# Patient Record
Sex: Male | Born: 1980 | Race: Black or African American | Hispanic: No | Marital: Single | Smoking: Current every day smoker
Health system: Southern US, Community
[De-identification: ages and names within clinical notes are randomized; demographics above are authoritative.]

## PROBLEM LIST (undated history)

## (undated) DIAGNOSIS — I10 Essential (primary) hypertension: Secondary | ICD-10-CM

---

## 2020-04-22 ENCOUNTER — Other Ambulatory Visit: Payer: Self-pay

## 2020-04-22 ENCOUNTER — Emergency Department: Payer: Self-pay

## 2020-04-22 ENCOUNTER — Observation Stay
Admission: EM | Admit: 2020-04-22 | Discharge: 2020-04-23 | Disposition: A | Discharge status: Home / Self Care | Payer: Self-pay | Attending: Hospitalist | Admitting: Hospitalist

## 2020-04-22 DIAGNOSIS — F1490 Cocaine use, unspecified, uncomplicated: Secondary | ICD-10-CM

## 2020-04-22 DIAGNOSIS — J189 Pneumonia, unspecified organism: Secondary | ICD-10-CM

## 2020-04-22 DIAGNOSIS — R0902 Hypoxemia: Secondary | ICD-10-CM

## 2020-04-22 DIAGNOSIS — R825 Elevated urine levels of drugs, medicaments and biological substances: Secondary | ICD-10-CM

## 2020-04-22 DIAGNOSIS — Z20822 Contact with and (suspected) exposure to covid-19: Secondary | ICD-10-CM | POA: Insufficient documentation

## 2020-04-22 DIAGNOSIS — I10 Essential (primary) hypertension: Secondary | ICD-10-CM | POA: Insufficient documentation

## 2020-04-22 DIAGNOSIS — J9601 Acute respiratory failure with hypoxia: Principal | ICD-10-CM | POA: Insufficient documentation

## 2020-04-22 DIAGNOSIS — E8729 Other acidosis: Secondary | ICD-10-CM

## 2020-04-22 DIAGNOSIS — G9341 Metabolic encephalopathy: Secondary | ICD-10-CM

## 2020-04-22 DIAGNOSIS — F17210 Nicotine dependence, cigarettes, uncomplicated: Secondary | ICD-10-CM | POA: Insufficient documentation

## 2020-04-22 DIAGNOSIS — R7989 Other specified abnormal findings of blood chemistry: Secondary | ICD-10-CM

## 2020-04-22 DIAGNOSIS — R000 Tachycardia, unspecified: Secondary | ICD-10-CM | POA: Insufficient documentation

## 2020-04-22 HISTORY — DX: Essential (primary) hypertension: I10

## 2020-04-22 LAB — CBC WITH DIFFERENTIAL/PLATELET
Abs Immature Granulocytes: 0.2 10*3/uL — ABNORMAL HIGH (ref 0.00–0.07)
Basophils Absolute: 0.1 10*3/uL (ref 0.0–0.1)
Basophils Relative: 1 %
Eosinophils Absolute: 0.3 10*3/uL (ref 0.0–0.5)
Eosinophils Relative: 2 %
HCT: 47.8 % (ref 39.0–52.0)
Hemoglobin: 15.2 g/dL (ref 13.0–17.0)
Immature Granulocytes: 1 %
Lymphocytes Relative: 25 %
Lymphs Abs: 3.9 10*3/uL (ref 0.7–4.0)
MCH: 22.5 pg — ABNORMAL LOW (ref 26.0–34.0)
MCHC: 31.8 g/dL (ref 30.0–36.0)
MCV: 70.7 fL — ABNORMAL LOW (ref 80.0–100.0)
Monocytes Absolute: 0.6 10*3/uL (ref 0.1–1.0)
Monocytes Relative: 4 %
Neutro Abs: 10.6 10*3/uL — ABNORMAL HIGH (ref 1.7–7.7)
Neutrophils Relative %: 67 %
Platelets: 323 10*3/uL (ref 150–400)
RBC: 6.76 MIL/uL — ABNORMAL HIGH (ref 4.22–5.81)
RDW: 18.1 % — ABNORMAL HIGH (ref 11.5–15.5)
WBC: 15.6 10*3/uL — ABNORMAL HIGH (ref 4.0–10.5)
nRBC: 0 % (ref 0.0–0.2)

## 2020-04-22 LAB — URINALYSIS, COMPLETE (UACMP) WITH MICROSCOPIC
Bacteria, UA: NONE SEEN
Bilirubin Urine: NEGATIVE
Glucose, UA: NEGATIVE mg/dL
Hgb urine dipstick: NEGATIVE
Ketones, ur: NEGATIVE mg/dL
Leukocytes,Ua: NEGATIVE
Nitrite: NEGATIVE
Protein, ur: NEGATIVE mg/dL
Specific Gravity, Urine: 1.034 — ABNORMAL HIGH (ref 1.005–1.030)
pH: 5 (ref 5.0–8.0)

## 2020-04-22 LAB — LACTIC ACID, PLASMA
Lactic Acid, Venous: 2.7 mmol/L (ref 0.5–1.9)
Lactic Acid, Venous: 1.1 mmol/L (ref 0.5–1.9)

## 2020-04-22 LAB — URINE DRUG SCREEN, QUALITATIVE (ARMC ONLY)
Amphetamines, Ur Screen: NOT DETECTED
Barbiturates, Ur Screen: NOT DETECTED
Benzodiazepine, Ur Scrn: NOT DETECTED
Cannabinoid 50 Ng, Ur ~~LOC~~: NOT DETECTED
Cocaine Metabolite,Ur ~~LOC~~: POSITIVE — AB
MDMA (Ecstasy)Ur Screen: NOT DETECTED
Methadone Scn, Ur: NOT DETECTED
Opiate, Ur Screen: NOT DETECTED
Phencyclidine (PCP) Ur S: NOT DETECTED
Tricyclic, Ur Screen: NOT DETECTED

## 2020-04-22 LAB — BLOOD GAS, ARTERIAL
Acid-base deficit: 2.8 mmol/L — ABNORMAL HIGH (ref 0.0–2.0)
Bicarbonate: 26 mmol/L (ref 20.0–28.0)
FIO2: 1
O2 Saturation: 99.7 %
Patient temperature: 37
pCO2 arterial: 62 mmHg — ABNORMAL HIGH (ref 32.0–48.0)
pH, Arterial: 7.23 — ABNORMAL LOW (ref 7.350–7.450)
pO2, Arterial: 222 mmHg — ABNORMAL HIGH (ref 83.0–108.0)

## 2020-04-22 LAB — COMPREHENSIVE METABOLIC PANEL
ALT: 16 U/L (ref 0–44)
AST: 23 U/L (ref 15–41)
Albumin: 3.9 g/dL (ref 3.5–5.0)
Alkaline Phosphatase: 82 U/L (ref 38–126)
Anion gap: 9 (ref 5–15)
BUN: 13 mg/dL (ref 6–20)
CO2: 23 mmol/L (ref 22–32)
Calcium: 8.5 mg/dL — ABNORMAL LOW (ref 8.9–10.3)
Chloride: 104 mmol/L (ref 98–111)
Creatinine, Ser: 1.23 mg/dL (ref 0.61–1.24)
GFR, Estimated: >60 mL/min (ref >60)
Glucose, Bld: 219 mg/dL — ABNORMAL HIGH (ref 70–99)
Potassium: 3.2 mmol/L — ABNORMAL LOW (ref 3.5–5.1)
Sodium: 136 mmol/L (ref 135–145)
Total Bilirubin: 0.4 mg/dL (ref 0.3–1.2)
Total Protein: 6.9 g/dL (ref 6.5–8.1)

## 2020-04-22 LAB — BLOOD GAS, VENOUS
Acid-base deficit: 4.6 mmol/L — ABNORMAL HIGH (ref 0.0–2.0)
Bicarbonate: 23.6 mmol/L (ref 20.0–28.0)
O2 Saturation: 88.7 %
Patient temperature: 37
pCO2, Ven: 55 mmHg (ref 44.0–60.0)
pH, Ven: 7.24 — ABNORMAL LOW (ref 7.250–7.430)
pO2, Ven: 66 mmHg — ABNORMAL HIGH (ref 32.0–45.0)

## 2020-04-22 LAB — TROPONIN I (HIGH SENSITIVITY)
Troponin I (High Sensitivity): 10 ng/L (ref <18)
Troponin I (High Sensitivity): 11 ng/L (ref <18)

## 2020-04-22 LAB — RESP PANEL BY RT-PCR (FLU A&B, COVID) ARPGX2
Influenza A by PCR: NEGATIVE
Influenza B by PCR: NEGATIVE
SARS Coronavirus 2 by RT PCR: NEGATIVE

## 2020-04-22 LAB — ACETAMINOPHEN LEVEL: Acetaminophen (Tylenol), Serum: <10 ug/mL — ABNORMAL LOW (ref 10–30)

## 2020-04-22 LAB — PROCALCITONIN: Procalcitonin: 0.34 ng/mL

## 2020-04-22 LAB — ETHANOL: Alcohol, Ethyl (B): 18 mg/dL — ABNORMAL HIGH (ref <10)

## 2020-04-22 LAB — BRAIN NATRIURETIC PEPTIDE: B Natriuretic Peptide: 38.2 pg/mL (ref 0.0–100.0)

## 2020-04-22 IMAGING — CT CT ANGIO CHEST
2 of 6 series · 19 of 46 positions shown · IV contrast (APPLIED)
Comparison: None.

CLINICAL DATA: Hypoxia with tachycardia

EXAM:
CT ANGIOGRAPHY CHEST WITH CONTRAST
TECHNIQUE: Multidetector CT imaging of the chest was performed using the
standard protocol during bolus administration of intravenous
contrast. Multiplanar CT image reconstructions and MIPs were
obtained to evaluate the vascular anatomy.
CONTRAST:  75mL OMNIPAQUE IOHEXOL 350 MG/ML SOLN

[Series 5: thins · axial · 0.62mm/px · z∈[-373,-98]mm · 17 of 303 slices shown]
[im 14/303  lung]
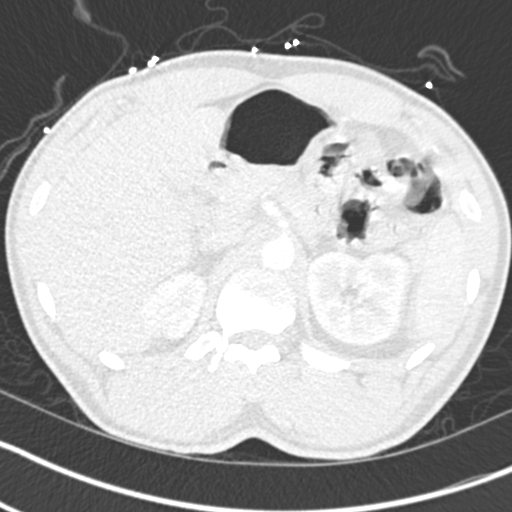
[im 27/303  soft-tissue]
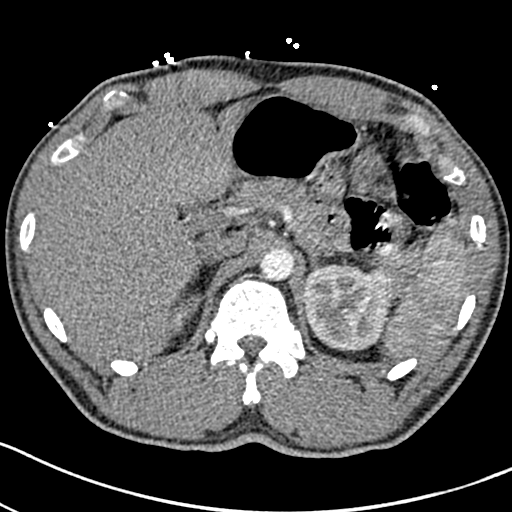
[im 53/303  lung]
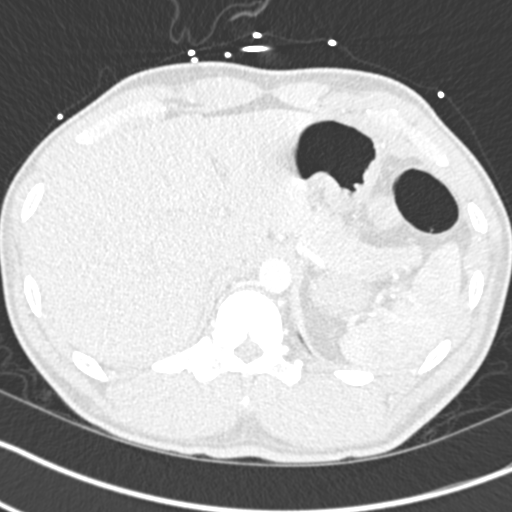
[im 66/303  soft-tissue]
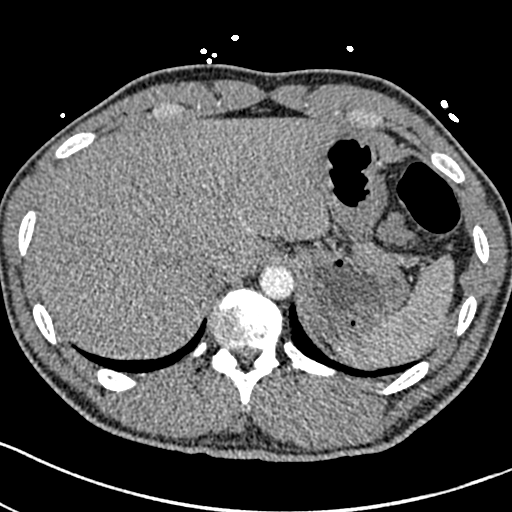
[im 79/303  lung]
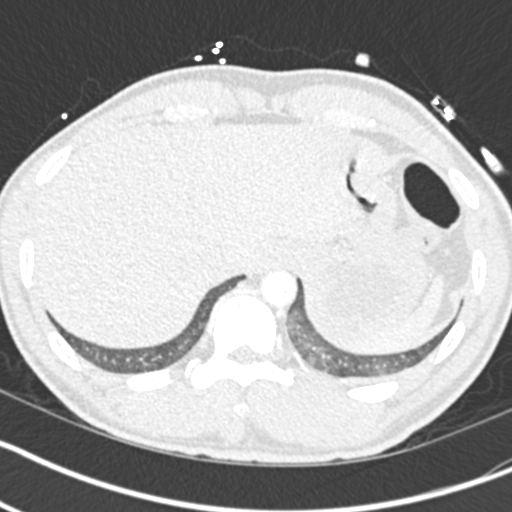
[im 106/303  soft-tissue]
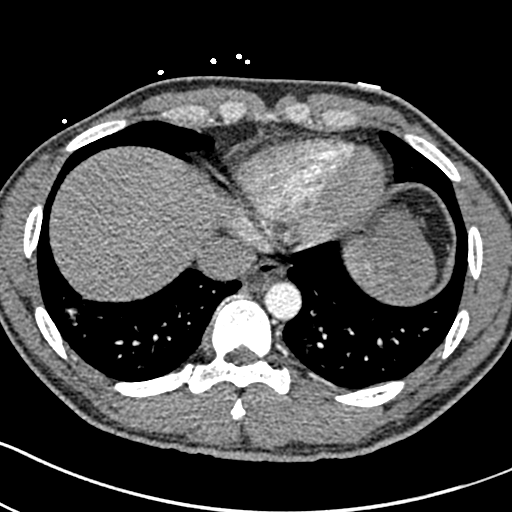
[im 119/303  lung]
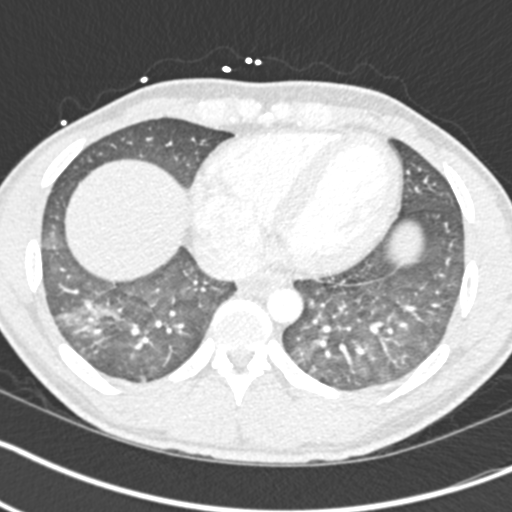
[im 132/303  soft-tissue]
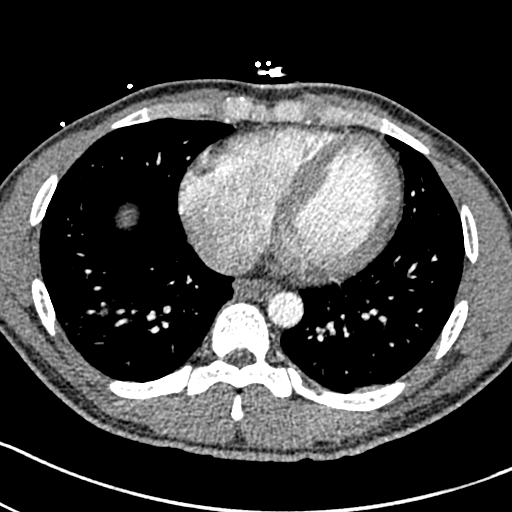
[im 158/303  lung]
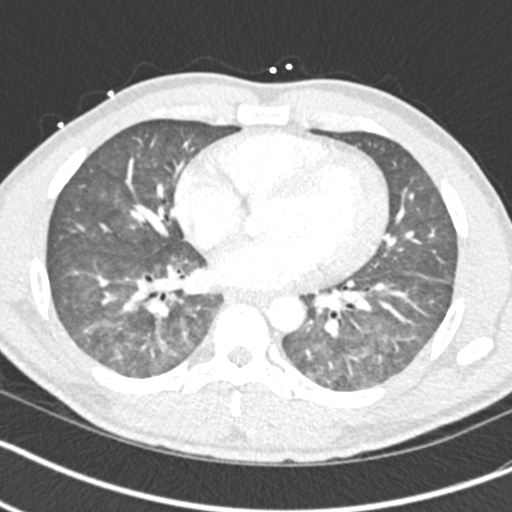
[im 171/303  soft-tissue]
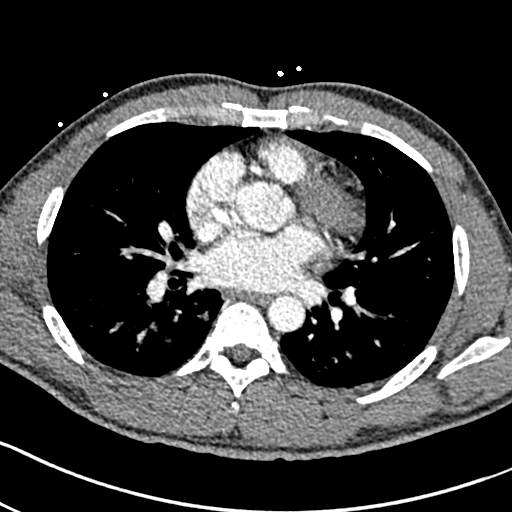
[im 184/303  lung]
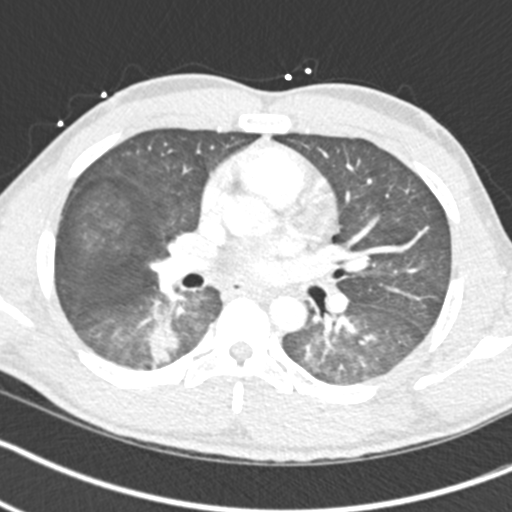
[im 197/303  soft-tissue]
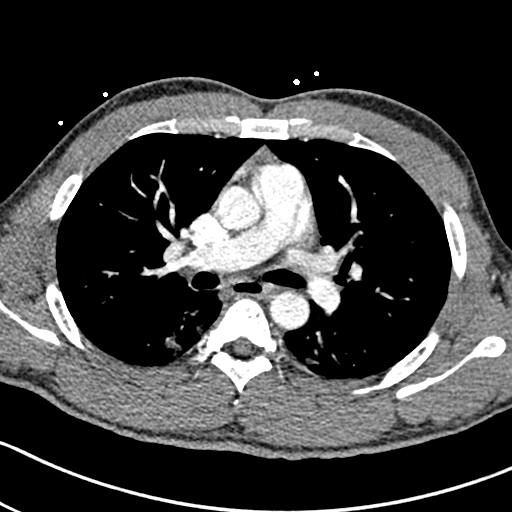
[im 224/303  lung]
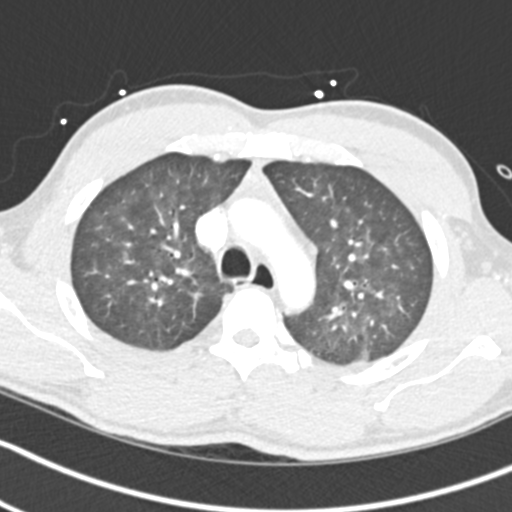
[im 237/303  soft-tissue]
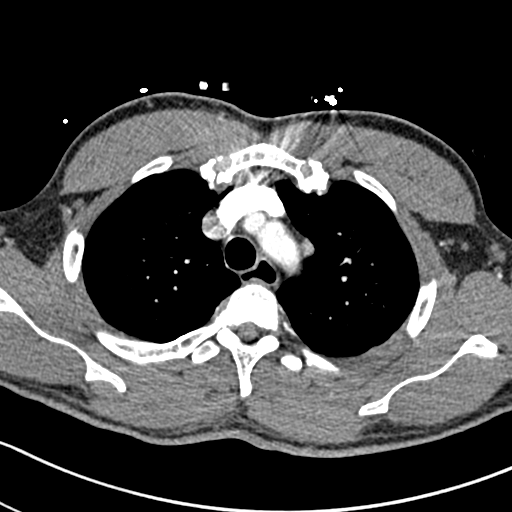
[im 250/303  lung]
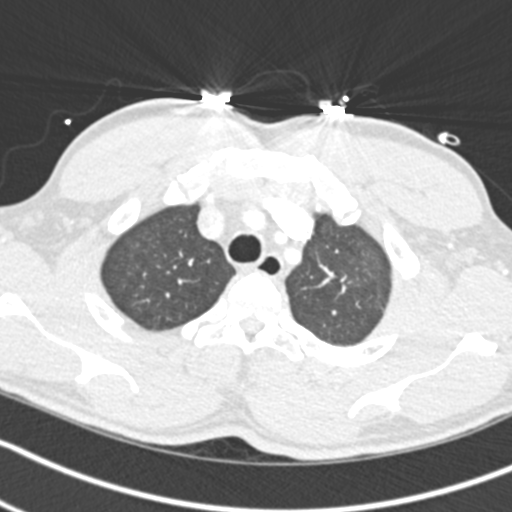
[im 276/303  soft-tissue]
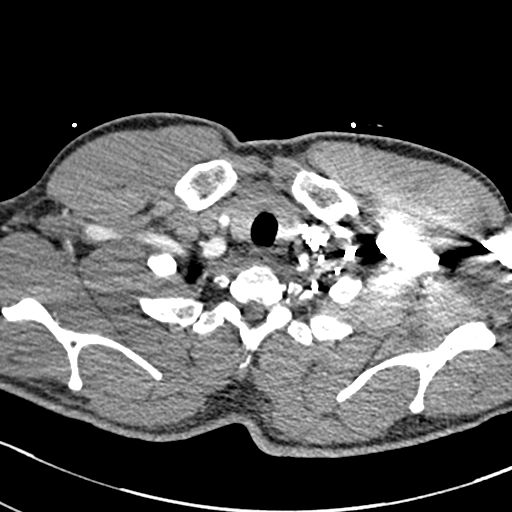
[im 289/303  lung]
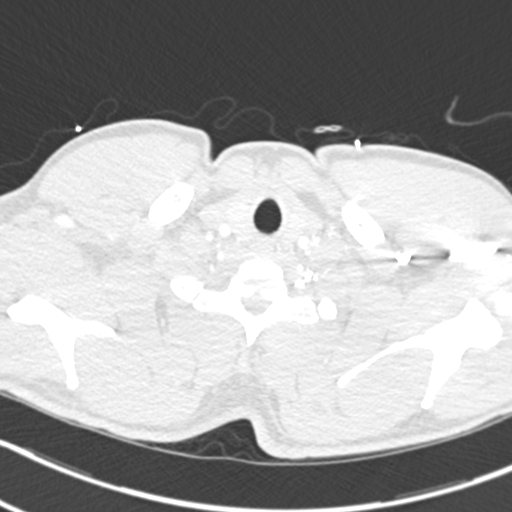

[Series 7: coronal mpr · coronal · 0.60mm/px · 2 of 88 slices shown]
[im 30/88  soft-tissue]
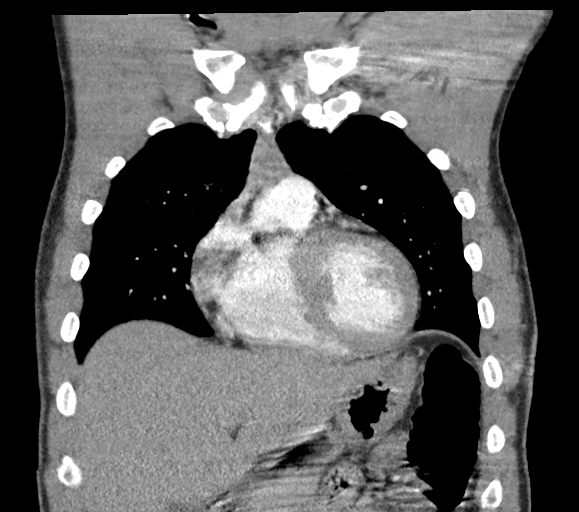
[im 59/88  soft-tissue]
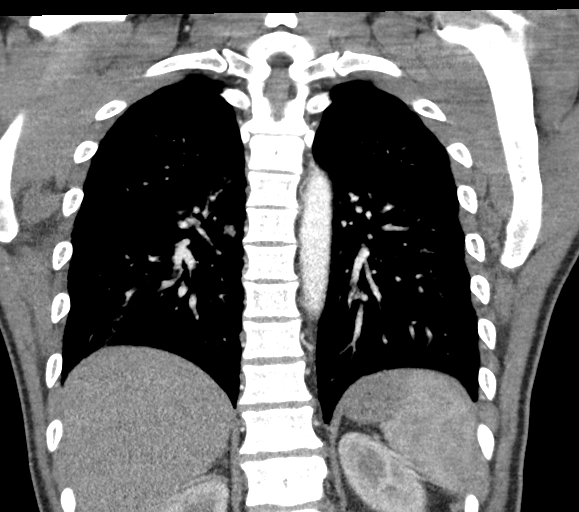

[19 of 46 positions shown; findings below may reference images not displayed]

FINDINGS: Cardiovascular: Satisfactory opacification of the pulmonary arteries
to the segmental level. No evidence of pulmonary embolism. Normal
heart size. No pericardial effusion.

Mediastinum/Nodes: Fluid within the distal esophagus may reflect
reflux. Thyroid is unremarkable. There are no enlarged lymph nodes
identified.

Lungs/Pleura: Bilateral ground-glass greater than consolidative
opacities. No pleural effusion. No pneumothorax.

Upper Abdomen: No acute abnormality.

Musculoskeletal: No acute osseous abnormality.

Review of the MIP images confirms the above findings.
IMPRESSION: No evidence of acute pulmonary embolism.

Bilateral pulmonary opacities likely reflect multifocal pneumonia.
Pulmonary edema is also a consideration.

## 2020-04-22 MED ORDER — ONDANSETRON HCL 4 MG/2ML IJ SOLN: 4.0000 mg | Freq: Four times a day (QID) | INTRAMUSCULAR | Status: DC | PRN

## 2020-04-22 MED ORDER — LACTATED RINGERS IV BOLUS
1000.0000 mL | Freq: Once | INTRAVENOUS | Status: AC
  Administered 2020-04-22: 18:00:00 1000 mL via INTRAVENOUS

## 2020-04-22 MED ORDER — CEFTRIAXONE SODIUM 2 G IJ SOLR: 2.0000 g | INTRAMUSCULAR | Status: DC

## 2020-04-22 MED ORDER — IOHEXOL 350 MG/ML SOLN
75.0000 mL | Freq: Once | INTRAVENOUS | Status: AC | PRN
  Administered 2020-04-22: 18:00:00 75 mL via INTRAVENOUS

## 2020-04-22 MED ORDER — ONDANSETRON HCL 4 MG PO TABS: 4.0000 mg | ORAL_TABLET | Freq: Four times a day (QID) | ORAL | Status: DC | PRN

## 2020-04-22 MED ORDER — SODIUM CHLORIDE 0.9 % IV SOLN: 2.0000 g | INTRAVENOUS | Status: DC

## 2020-04-22 MED ORDER — SODIUM CHLORIDE 0.9 % IV SOLN
500.0000 mg | Freq: Once | INTRAVENOUS | Status: AC
  Administered 2020-04-22: 18:00:00 500 mg via INTRAVENOUS
  Filled 2020-04-22: qty 500

## 2020-04-22 MED ORDER — SODIUM CHLORIDE 0.9 % IV SOLN
1.0000 g | Freq: Once | INTRAVENOUS | Status: AC
  Administered 2020-04-22: 17:00:00 1 g via INTRAVENOUS
  Filled 2020-04-22: qty 10

## 2020-04-22 MED ORDER — SODIUM CHLORIDE 0.9 % IV SOLN: 500.0000 mg | INTRAVENOUS | Status: DC

## 2020-04-22 MED ORDER — SODIUM CHLORIDE 0.9 % IV SOLN
1.0000 g | Freq: Once | INTRAVENOUS | Status: AC
  Administered 2020-04-22: 1 g via INTRAVENOUS
  Filled 2020-04-22: qty 10

## 2020-04-22 MED ORDER — HYDROCODONE-ACETAMINOPHEN 5-325 MG PO TABS: 1.0000 | ORAL_TABLET | ORAL | Status: DC | PRN

## 2020-04-22 MED ORDER — ACETAMINOPHEN 650 MG RE SUPP: 650.0000 mg | Freq: Four times a day (QID) | RECTAL | Status: DC | PRN

## 2020-04-22 MED ORDER — ENOXAPARIN SODIUM 60 MG/0.6ML ~~LOC~~ SOLN
45.0000 mg | SUBCUTANEOUS | Status: DC
  Administered 2020-04-22: 21:00:00 45 mg via SUBCUTANEOUS
  Filled 2020-04-22: qty 0.6

## 2020-04-22 MED ORDER — SODIUM CHLORIDE 0.9 % IV SOLN: INTRAVENOUS | Status: DC

## 2020-04-22 MED ORDER — SODIUM CHLORIDE 0.9 % IV BOLUS
1000.0000 mL | Freq: Once | INTRAVENOUS | Status: AC
  Administered 2020-04-22: 17:00:00 1000 mL via INTRAVENOUS

## 2020-04-22 MED ORDER — ACETAMINOPHEN 325 MG PO TABS: 650.0000 mg | ORAL_TABLET | Freq: Four times a day (QID) | ORAL | Status: DC | PRN

## 2020-04-22 MED ORDER — AZITHROMYCIN 500 MG IV SOLR: 500.0000 mg | INTRAVENOUS | Status: DC

## 2020-04-22 NOTE — Progress Notes (Signed)
PHARMACIST - PHYSICIAN COMMUNICATION  CONCERNING:  Enoxaparin (Lovenox) for DVT Prophylaxis    RECOMMENDATION: Patient was prescribed enoxaprin 40mg  q24 hours for VTE prophylaxis.   Filed Weights   04/22/20 1435  Weight: 92.4 kg (203 lb 11.3 oz)    Body mass index is 30.08 kg/m.  Estimated Creatinine Clearance: 89.7 mL/min (by C-G formula based on SCr of 1.23 mg/dL).   Based on [facility] policy patient is candidate for enoxaparin 0.5mg /kg TBW SQ every 24 hours based on BMI being >30.   DESCRIPTION: Pharmacy has adjusted enoxaparin dose per [facility] policy.  Patient is now receiving enoxaparin 45 mg every 24hours    [facility], PharmD Clinical Pharmacist  04/22/2020 7:56 PM

## 2020-04-22 NOTE — H&P (Signed)
History and Physical    Douglas Barker OVF:[id number] DOB: [birth year] DOA: 04/22/2020  PCP: Patient, No Pcp Per   Patient coming from: Home  I have personally briefly reviewed patient's old medical records in [facility]  Chief Complaint: Passed out in car  HPI: Douglas Barker is a 39 y.o. male with past medical history significant for hypertension who was brought in by EMS after passing out while sitting in the driver seat of a car with the car running.  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  On their arrival O2 sat was in the low 80s on room air improving to 89% on 3 L. After arrival of patient's significant other, she says that for the past several months patient has had episodes where he would fall asleep suddenly.  She states he took a drink at a restaurant the night prior and when she got home today he had a half empty can of malt beside the bed.  he denies illicit drug use.  Drinks only on occasions. He denies feeling unwell prior to this incident.  He was in his usual state of health.  He denies prior cough, shortness of breath, fever or chest pain or any recent illness ED Course: On arrival to the ER, BP 158/98, pulse 138, respirations 29 satting at 97% on nonrebreather blood work significant for leukocytosis of 15,000 with initial lactic acid of 2.7>>1.1.  ABG showed pH of 7.24, PCO2 of 55 and PO2 of 66.Marland Kitchen  EtOH level 18, UDS positive only for cocaine, negative for opiates.  Procalcitonin 0.34.  BNP 38.  Troponin negative at 11.  Urinalysis without pyuria.  Covid and flu test negative EKG as reviewed by me : Sinus tach at 135 with no acute ST-T wave changes. Imaging:CTA chest showed no evidence of PE did show bilateral pulmonary opacities likely reflecting multifocal pneumonia, pulmonary edema is also a  consideration  Review of Systems: As per HPI otherwise all other systems on review of systems negative, though limited due to somnolence.   Past Medical History:  Diagnosis Date  . Hypertension     History reviewed. No pertinent surgical history.   reports that he has been smoking cigarettes. He has been smoking about 0.20 packs per day. He has never used smokeless tobacco. He reports current alcohol use of about 2.0 standard drinks of alcohol per week. No history on file for drug use.  Not on File  History reviewed. No pertinent family history.    Prior to Admission medications   Not on File    Physical Exam: Vitals:   04/22/20 1830 04/22/20 1845 04/22/20 1900 04/22/20 1915  BP: 128/77  140/76   Pulse: 86 85 83 78  Resp:      Temp:      TempSrc:      SpO2: 93% 95% 91% 93%  Weight:      Height:         Vitals:   04/22/20 1830 04/22/20 1845 04/22/20 1900 04/22/20 1915  BP: 128/77  140/76   Pulse: 86 85 83 78  Resp:      Temp:      TempSrc:      SpO2: 93% 95% 91% 93%  Weight:      Height:          Constitutional:  Very  somnolent but  intermittently awake and alert but falling asleep in the middle of his sentences. Not in any apparent distress HEENT:      Head: Normocephalic and atraumatic.         Eyes: PERLA, EOMI, Conjunctivae are normal. Sclera is non-icteric.       Mouth/Throat: Mucous membranes are moist.       Neck: Supple with no signs of meningismus. Cardiovascular: Regular rate and rhythm. No murmurs, gallops, or rubs. 2+ symmetrical distal pulses are present . No JVD. No LE edema Respiratory: Respiratory effort normal .Lungs sounds clear bilaterally. No wheezes, crackles, or rhonchi.  Gastrointestinal: Soft, non tender, and non distended with positive bowel sounds.  Genitourinary: No CVA tenderness. Musculoskeletal: Nontender with normal range of motion in all extremities. No cyanosis, or erythema of extremities. Neurologic:  Face is symmetric.  Moving all extremities. No gross focal neurologic deficits . Skin: Skin is warm, dry.  No rash or ulcers Psychiatric: Mood and affect are normal    Labs on Admission: I have personally reviewed following labs and imaging studies  CBC: Recent Labs  Lab 04/22/20 1425  WBC 15.6*  NEUTROABS 10.6*  HGB 15.2  HCT 47.8  MCV 70.7*  PLT 323   Basic Metabolic Panel: Recent Labs  Lab 04/22/20 1425  NA 136  K 3.2*  CL 104  CO2 23  GLUCOSE 219*  BUN 13  CREATININE 1.23  CALCIUM 8.5*   GFR: Estimated Creatinine Clearance: 89.7 mL/min (by C-G formula based on SCr of 1.23 mg/dL). Liver Function Tests: Recent Labs  Lab 04/22/20 1425  AST 23  ALT 16  ALKPHOS 82  BILITOT 0.4  PROT 6.9  ALBUMIN 3.9   No results for input(s): LIPASE, AMYLASE in the last 168 hours. No results for input(s): AMMONIA in the last 168 hours. Coagulation Profile: No results for input(s): INR, PROTIME in the last 168 hours. Cardiac Enzymes: No results for input(s): CKTOTAL, CKMB, CKMBINDEX, TROPONINI in the last 168 hours. BNP (last 3 results) No results for input(s): PROBNP in the last 8760 hours. HbA1C: No results for input(s): HGBA1C in the last 72 hours. CBG: No results for input(s): GLUCAP in the last 168 hours. Lipid Profile: No results for input(s): CHOL, HDL, LDLCALC, TRIG, CHOLHDL, LDLDIRECT in the last 72 hours. Thyroid Function Tests: No results for input(s): TSH, T4TOTAL, FREET4, T3FREE, THYROIDAB in the last 72 hours. Anemia Panel: No results for input(s): VITAMINB12, FOLATE, FERRITIN, TIBC, IRON, RETICCTPCT in the last 72 hours. Urine analysis:    Component Value Date/Time   COLORURINE YELLOW (A) 04/22/2020 1910   APPEARANCEUR CLEAR (A) 04/22/2020 1910   LABSPEC 1.034 (H) 04/22/2020 1910   PHURINE 5.0 04/22/2020 1910   GLUCOSEU NEGATIVE 04/22/2020 1910   HGBUR NEGATIVE 04/22/2020 1910   BILIRUBINUR NEGATIVE 04/22/2020 1910   KETONESUR NEGATIVE 04/22/2020 1910   PROTEINUR  NEGATIVE 04/22/2020 1910   NITRITE NEGATIVE 04/22/2020 1910   LEUKOCYTESUR NEGATIVE 04/22/2020 1910    Radiological Exams on Admission: CT Angio Chest PE W and/or Wo Contrast  Result Date: 04/22/2020 CLINICAL DATA:  Hypoxia with tachycardia EXAM: CT ANGIOGRAPHY CHEST WITH CONTRAST TECHNIQUE: Multidetector CT imaging of the chest was performed using the standard protocol during bolus administration of intravenous contrast. Multiplanar CT image reconstructions and MIPs were obtained to evaluate the vascular anatomy. CONTRAST:  38mL OMNIPAQUE IOHEXOL 350 MG/ML SOLN COMPARISON:  None. FINDINGS: Cardiovascular: Satisfactory opacification of the pulmonary arteries to the segmental level. No evidence of pulmonary embolism. Normal heart  size. No pericardial effusion. Mediastinum/Nodes: Fluid within the distal esophagus may reflect reflux. Thyroid is unremarkable. There are no enlarged lymph nodes identified. Lungs/Pleura: Bilateral ground-glass greater than consolidative opacities. No pleural effusion. No pneumothorax. Upper Abdomen: No acute abnormality. Musculoskeletal: No acute osseous abnormality. Review of the MIP images confirms the above findings. IMPRESSION: No evidence of acute pulmonary embolism. Bilateral pulmonary opacities likely reflect multifocal pneumonia. Pulmonary edema is also a consideration. Electronically Signed   By: Guadlupe Spanish M.D.   On: 04/22/2020 18:26   DG Chest Portable 1 View  Result Date: 04/22/2020 CLINICAL DATA:  Hypoxia. EXAM: PORTABLE CHEST 1 VIEW COMPARISON:  None. FINDINGS: The heart size and mediastinal contours are within normal limits. Both lungs are clear. No pneumothorax or pleural effusion is noted. The visualized skeletal structures are unremarkable. IMPRESSION: No active disease. Electronically Signed   By: Lupita Raider M.D.   On: 04/22/2020 14:56     Assessment/Plan 39 year old male with history of hypertension who was brought in by EMS after passing out  while sitting in the driver seat of a car reversed with Narcan.  O2 sat 80% on room air   Acute respiratory failure with hypoxia with respiratory acidosis Urine drug screen positive for cocaine  Acute metabolic encephalopathy -Patient found unconscious sitting in the car, reversed with Narcan but continues to be very somnolent, finding it difficult to stay awake -O2 sat on arrival of EMS in the 80s with ABG showing pH 7.24, PO2 66 with normal PCO2 -Urine drug screen positive for cocaine negative for opiates -Acute respiratory failure secondary to respiratory depression from possible ingestion, multifocal pneumonia/possible pulmonary edema -Patient's girlfriend who gives the history reports several months of patient suddenly falling asleep so may need sleep study -CTA chest negative for acute PE -Urine drug screen positive for cocaine but negative for opioids -Supplemental oxygen to keep sats over 92% -Neurochecks and aspiration precautions  Multifocal pneumonia -CTA showing bilateral multifocal opacities that can reflect pneumonia or pulmonary edema. -Does not appear to be aspiration -Rocephin and azithromycin -Supplemental oxygen -Supportive and symptomatic care    DVT prophylaxis: Lovenox  Code Status: full code  Family Communication: Girlfriend at baseline, French Ana suits Disposition Plan: Back to previous home environment Consults called: none  Status: Observation    Andris Baumann MD [facility]     04/22/2020, 7:54 PM

## 2020-04-22 NOTE — ED Notes (Signed)
Resumed care from noah rn  Pt alert  ua to lab  Family with pt

## 2020-04-22 NOTE — ED Notes (Signed)
Pt reports smoking occasionally, says it takes him a few days to go through a pack. Denies any other drug use.   Pt says the last thing he remembers is dropping his cousin off. He reports he does not know what happened after, but he fell asleep. No notable needle tracks on arms. EMS IV attempt, but nothing else visible on arms. Pt states he donates plasma sometimes, some scarring to AC visible.   Pt mother concerned about drug use, though she states that has never been a problem before. States she does not have any specific reason to be concerned for that, aside from events per EMS.

## 2020-04-22 NOTE — ED Notes (Signed)
Pt wife at bedside. Reports that pt has been having problems with drowsiness/frequently falling asleep for years now. She states they have not been able to identify a clear cause, although they want to know about ruling out sleep apnea or narcolepsy.   Pt resting

## 2020-04-22 NOTE — ED Notes (Signed)
Medication Reconciliation Report  For Home History Technicians  HIGHLIGHTS:  1. The patient WAS personally interviewed 2. If not, what was the main source used: NOT APPLICABLE 3. Does the patient appear to take any anti-coagulation agents (e.g. warfarin, Eliquis or Xarelto): NO 4. Does the patient appear to take any anti-convulsant agents (e.g. divalproex, levetiracetam or phenytoin): NO 5. Does the patient appear to use any insulin products (e.g. Lantus, Novolin or Humalog): NO 6. Does the patient appear to take any "beta-blockers" (e.g. metoprolol, carvedilol or bisoprolol: NO  BARRIERS:  1. Were there any barriers that prevented or complicated the medication reconciliation process: NO 2. If yes, what was the primary barrier encountered: None 3. Does the patient appear compliant with prescribed medications: NO 4. Does the patient express any barriers with compliance: YES 5. What is the primary barrier the patient reports: Access to primary care   NOTES:[Include any concerns, remarks or complaints the patient expresses regarding medication therapy. Any observations or other information that might be useful to the treatment team can also be included. Immediate needs or concerns should be referred to the RN or appropriate member of the treatment team.]  Patient was interviewed and reports only taking HCTZ 25mg  daily. He does, however, admit taking it less frequently due to a lack of refills and no current PCP. Patient requests new RX for HYDROCHLOROTHIAZIDE 25mg  upon discharge, if deemed appropriate.    , CPhT [place] at [facility] [address] [place], [address] [place]  ** The above is intended solely for informational and/or communicative purposes. It should in no way be considered an endorsement of any specific treatment, therapy or action. **

## 2020-04-22 NOTE — ED Provider Notes (Signed)
[facility] Emergency Department Provider Note   ____________________________________________   Event Date/Time   First MD Initiated Contact with Patient 04/22/20 1408     (approximate)  I have reviewed the triage vital signs and the nursing notes.   HISTORY  Chief Complaint Respiratory Distress    HPI Douglas Barker is a 39 y.o. male EMS was called for CPR in progress.  On arrival there was no CPR being done.  Patient was sitting in driver seat of car with the car running.  Patient received a total of 3 mg of Narcan to intranasal and 1 IV before he woke up completely.  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  O2 sats here in the emergency room are in the high 80s with crackles bilaterally.  Patient denies doing any drugs.         Past Medical History:  Diagnosis Date  . Hypertension     There are no problems to display for this patient.    Prior to Admission medications   Not on File    Allergies Patient has no allergy information on record.  History reviewed. No pertinent family history.  Social History Social History   Tobacco Use  . Smoking status: Current Every Day Smoker    Packs/day: 0.20    Types: Cigarettes    Review of Systems  Constitutional: No fever/chills Eyes: No visual changes. ENT: No sore throat. Cardiovascular: Denies chest pain. Respiratory: Denies shortness of breath. Gastrointestinal: No abdominal pain.  No nausea, no vomiting.  No diarrhea.  No constipation. Genitourinary: Negative for dysuria. Musculoskeletal: Negative for back pain. Skin: Negative for rash. Neurological: Negative for headaches, focal weakness  ____________________________________________   PHYSICAL EXAM:  VITAL SIGNS: ED Triage Vitals  Enc Vitals Group     BP      Pulse      Resp      Temp      Temp src      SpO2      Weight      Height      Head Circumference      Peak Flow      Pain Score       Pain Loc      Pain Edu?      Excl. in GC?     Constitutional: Alert and oriented. Well appearing and in no acute distress. Eyes: Conjunctivae are normal. PER. EOMI. Head: Atraumatic. Nose: No congestion/rhinnorhea. Mouth/Throat: Mucous membranes are moist.  Oropharynx non-erythematous. Neck: No stridor. Cardiovascular: Normal rate, regular rhythm. Grossly normal heart sounds.  Good peripheral circulation. Respiratory: Normal respiratory effort.  No retractions. Lungs crackles two thirds of the way up bilaterally Gastrointestinal: Soft and nontender. No distention. No abdominal bruits.  Musculoskeletal: No lower extremity tenderness nor edema.   Neurologic:  Normal speech and language. No gross focal neurologic deficits are appreciated. N Skin:  Skin is warm, dry and intact. No rash noted. Psychiatric: Mood and affect are normal. Speech and behavior are normal.  ____________________________________________   LABS (all labs ordered are listed, but only abnormal results are displayed)  Labs Reviewed  COMPREHENSIVE METABOLIC PANEL - Abnormal; Notable for the following components:      Result Value   Potassium 3.2 (*)    Glucose, Bld 219 (*)    Calcium 8.5 (*)    All other components within normal limits  ACETAMINOPHEN LEVEL - Abnormal; Notable for the following components:   Acetaminophen (Tylenol),  Serum <10 (*)    All other components within normal limits  ETHANOL - Abnormal; Notable for the following components:   Alcohol, Ethyl (B) 18 (*)    All other components within normal limits  LACTIC ACID, PLASMA - Abnormal; Notable for the following components:   Lactic Acid, Venous 2.7 (*)    All other components within normal limits  CBC WITH DIFFERENTIAL/PLATELET - Abnormal; Notable for the following components:   WBC 15.6 (*)    RBC 6.76 (*)    MCV 70.7 (*)    MCH 22.5 (*)    RDW 18.1 (*)    Neutro Abs 10.6 (*)    Abs Immature Granulocytes 0.20 (*)    All other components  within normal limits  BLOOD GAS, VENOUS - Abnormal; Notable for the following components:   pH, Ven 7.24 (*)    pO2, Ven 66.0 (*)    Acid-base deficit 4.6 (*)    All other components within normal limits  BLOOD GAS, ARTERIAL - Abnormal; Notable for the following components:   pH, Arterial 7.23 (*)    pCO2 arterial 62 (*)    pO2, Arterial 222 (*)    Acid-base deficit 2.8 (*)    All other components within normal limits  RESP PANEL BY RT-PCR (FLU A&B, COVID) ARPGX2  BRAIN NATRIURETIC PEPTIDE  LACTIC ACID, PLASMA  PROCALCITONIN  URINALYSIS, COMPLETE (UACMP) WITH MICROSCOPIC  URINE DRUG SCREEN, QUALITATIVE ([facility] ONLY)  TROPONIN I (HIGH SENSITIVITY)  TROPONIN I (HIGH SENSITIVITY)   ____________________________________________  EKG EKG read interpreted by me shows sinus tachycardia rate of 135 normal axis no acute ST-T wave changes some decreased R wave progression. ____________________________________________  RADIOLOGY Jill Poling, personally viewed and evaluated these images (plain radiographs) as part of my medical decision making, as well as reviewing the written report by the radiologist.  ED MD interpretation: Chest x-ray reviewed by me shows some fine interstitial infiltrate bilateral  Official radiology report(s): CT Angio Chest PE W and/or Wo Contrast  Result Date: 04/22/2020 CLINICAL DATA:  Hypoxia with tachycardia EXAM: CT ANGIOGRAPHY CHEST WITH CONTRAST TECHNIQUE: Multidetector CT imaging of the chest was performed using the standard protocol during bolus administration of intravenous contrast. Multiplanar CT image reconstructions and MIPs were obtained to evaluate the vascular anatomy. CONTRAST:  67mL OMNIPAQUE IOHEXOL 350 MG/ML SOLN COMPARISON:  None. FINDINGS: Cardiovascular: Satisfactory opacification of the pulmonary arteries to the segmental level. No evidence of pulmonary embolism. Normal heart size. No pericardial effusion. Mediastinum/Nodes: Fluid within  the distal esophagus may reflect reflux. Thyroid is unremarkable. There are no enlarged lymph nodes identified. Lungs/Pleura: Bilateral ground-glass greater than consolidative opacities. No pleural effusion. No pneumothorax. Upper Abdomen: No acute abnormality. Musculoskeletal: No acute osseous abnormality. Review of the MIP images confirms the above findings. IMPRESSION: No evidence of acute pulmonary embolism. Bilateral pulmonary opacities likely reflect multifocal pneumonia. Pulmonary edema is also a consideration. Electronically Signed   By: Guadlupe Spanish M.D.   On: 04/22/2020 18:26   DG Chest Portable 1 View  Result Date: 04/22/2020 CLINICAL DATA:  Hypoxia. EXAM: PORTABLE CHEST 1 VIEW COMPARISON:  None. FINDINGS: The heart size and mediastinal contours are within normal limits. Both lungs are clear. No pneumothorax or pleural effusion is noted. The visualized skeletal structures are unremarkable. IMPRESSION: No active disease. Electronically Signed   By: Lupita Raider M.D.   On: 04/22/2020 14:56    ____________________________________________   PROCEDURES  Procedure(s) performed (including Critical Care): Critical care time half  an hour.  This includes talking to the patient several times reviewing his labs and records talking to the patient's wife in person and mother on the telephone and convincing the patient to stay in the hospital.  Procedures   ____________________________________________   INITIAL IMPRESSION / ASSESSMENT AND PLAN / ED COURSE  Patient with loss of consciousness that appeared to respond to 3 doses of Narcan.  Patient has crackles bilaterally on lung exam and is hypoxic with O2 sats in the 8889 range on room air.  Chest x-ray to my reading seems to have some very faint bilateral increased interstitial markings.  Patient's white count is normal he is not febrile however his procalcitonin is elevated at 0.34.  Patient possibly has early ARDS due to narcotic use or a  mild pneumonia.  Since he is hypoxic he will have to come into the hospital.  Patient has not been able to provide [place] with a urine specimen yet.  CT angios shows multifocal pneumonia or pulmonary edema.  Patient's BNP is negative.             ____________________________________________   FINAL CLINICAL IMPRESSION(S) / ED DIAGNOSES  Final diagnoses:  Hypoxia  Tachycardia  Elevated procalcitonin     ED Discharge Orders    None      *Please note:  Zalen Standard was evaluated in Emergency Department on 04/22/2020 for the symptoms described in the history of present illness. He was evaluated in the context of the global COVID-19 pandemic, which necessitated consideration that the patient might be at risk for infection with the SARS-CoV-2 virus that causes COVID-19. Institutional protocols and algorithms that pertain to the evaluation of patients at risk for COVID-19 are in a state of rapid change based on information released by regulatory bodies including the CDC and federal and state organizations. These policies and algorithms were followed during the patient's care in the ED.  Some ED evaluations and interventions may be delayed as a result of limited staffing during and the pandemic.*   Note:  This document was prepared using Dragon voice recognition software and may include unintentional dictation errors.    Arnaldo Natal, MD 04/22/20 1902

## 2020-04-22 NOTE — ED Notes (Signed)
Report off to chrissy rn  

## 2020-04-22 NOTE — ED Notes (Signed)
Pt reports hx of HTN, missed his meds this AM. Does not know what he takes.   Pt has removed NRB several times, each time desatting to 88-89%. Pt has NRB in place at 15L  Pt is a/o x4, but does appear somewhat slightly out of it. Pt gaze sometimes wanders, pt sometimes gets distracted while talking/listening. Pt does appear slightly drowsy, but RR maintained

## 2020-04-22 NOTE — ED Triage Notes (Signed)
Pt arrives via [facility] from  with complaint of CPR. Upon arrival, pt was sitting in driver's seat of car with car running. Pt had received 1mg  narcan with improvement.  Pt RR dropped again, received another 1mg  narcan with improvement. Additional 1mg  Narcan after IV start.   Upon arrival pt is alert and oriented x4, responsive to questions. 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 Pt SpO2 on arrival low 80's RA,  slow improvement to 89% 3L    190/105 BP SpO2 80's RA 90's 2L 270 CBG 140's HR

## 2020-04-23 DIAGNOSIS — J9601 Acute respiratory failure with hypoxia: Principal | ICD-10-CM

## 2020-04-23 LAB — CBC
HCT: 41.4 % (ref 39.0–52.0)
Hemoglobin: 13.3 g/dL (ref 13.0–17.0)
MCH: 22.6 pg — ABNORMAL LOW (ref 26.0–34.0)
MCHC: 32.1 g/dL (ref 30.0–36.0)
MCV: 70.3 fL — ABNORMAL LOW (ref 80.0–100.0)
Platelets: 242 10*3/uL (ref 150–400)
RBC: 5.89 MIL/uL — ABNORMAL HIGH (ref 4.22–5.81)
RDW: 18.1 % — ABNORMAL HIGH (ref 11.5–15.5)
WBC: 15.8 10*3/uL — ABNORMAL HIGH (ref 4.0–10.5)
nRBC: 0 % (ref 0.0–0.2)

## 2020-04-23 LAB — BASIC METABOLIC PANEL
Anion gap: 6 (ref 5–15)
BUN: 9 mg/dL (ref 6–20)
CO2: 25 mmol/L (ref 22–32)
Calcium: 8.5 mg/dL — ABNORMAL LOW (ref 8.9–10.3)
Chloride: 105 mmol/L (ref 98–111)
Creatinine, Ser: 1.05 mg/dL (ref 0.61–1.24)
GFR, Estimated: >60 mL/min (ref >60)
Glucose, Bld: 107 mg/dL — ABNORMAL HIGH (ref 70–99)
Potassium: 3.9 mmol/L (ref 3.5–5.1)
Sodium: 136 mmol/L (ref 135–145)

## 2020-04-23 LAB — HIV ANTIBODY (ROUTINE TESTING W REFLEX): HIV Screen 4th Generation wRfx: NONREACTIVE

## 2020-04-23 LAB — CREATININE, SERUM
Creatinine, Ser: 0.99 mg/dL (ref 0.61–1.24)
GFR, Estimated: >60 mL/min (ref >60)

## 2020-04-23 MED ORDER — AMOXICILLIN-POT CLAVULANATE 875-125 MG PO TABS: 1.0000 | ORAL_TABLET | Freq: Two times a day (BID) | ORAL | 0 refills | Status: AC

## 2020-04-23 MED ORDER — HYDROCHLOROTHIAZIDE 25 MG PO TABS: 25.0000 mg | ORAL_TABLET | Freq: Every day | ORAL | 2 refills | Status: AC

## 2020-04-23 NOTE — ED Notes (Signed)
Pt taken off [place]. Pt oxygen maintaining 96-97%

## 2020-04-23 NOTE — ED Notes (Signed)
Pt assisted to bathroom by this RN, standby support only. Sats dipped to 91% then back up to 96% on RA when back in bed. Tolerated well. Wanting to go home.

## 2020-04-23 NOTE — ED Notes (Signed)
Dr at bedside.

## 2020-04-23 NOTE — Discharge Summary (Signed)
Physician Discharge Summary   Douglas Barker  male DOB: 04-07-1981  ZOX:[id number]  PCP: Patient, No Pcp Per  Admit date: 04/22/2020 Discharge date: 04/23/2020  Admitted From: home Disposition:  home CODE STATUS: Full code    Hospital Course:  For full details, please see H&P, progress notes, consult notes and ancillary notes.  Briefly,  Douglas Barker is a 39 y.o. male with past medical history significant for hypertension who was brought in by EMS after passing out while sitting in the driver seat of a car with the car running.  EMS was initially called with a complaint of CPR however by their arrival patient had already received 1 mg Narcan x2. 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  On their arrival O2 sat was in the low 80s on room air improving to 89% on 3 L.  Acute respiratory failure with hypoxia with respiratory acidosis cocaine use Acute metabolic encephalopathy Patient found unconscious sitting in the car, reversed with Narcan but continues to be very somnolent, finding it difficult to stay awake.  O2 sat on arrival of EMS in the 80s with ABG showing pH 7.24, PO2 66 with normal PCO2.  Urine drug screen positive for cocaine negative for opiates.  Alcohol 18.  CTA chest negative for acute PE.  Pt had respiratory depression from possible ingestion, multifocal pneumonia.  The next day, pt's mental status was back to normal and completely alert and sating well on room air.  Pt denied any complaints and requested to go home.    Multifocal pneumonia CTA showing bilateral multifocal opacities that can reflect pneumonia or pulmonary edema.  Pt was started on Rocephin and azithromycin.  Pt had WBC 15.6 and Procal 0.34.  The day after admission, pt denied any respiratory issues, however, given imaging and lab findings, pt was discharged on a course of Augmentin for 5 days.   Discharge Diagnoses:  Principal Problem:   Acute  respiratory failure with hypoxia (HCC) Active Problems:   Multifocal pneumonia   Positive urine drug screen   Cocaine use   Acute metabolic encephalopathy   Respiratory acidosis    Discharge Instructions:  Allergies as of 04/23/2020   Not on File     Medication List    TAKE these medications   amoxicillin-clavulanate 875-125 MG tablet Commonly known as: Augmentin Take 1 tablet by mouth 2 (two) times daily for 5 days. Antibiotic.   hydrochlorothiazide 25 MG tablet Commonly known as: HYDRODIURIL Take 1 tablet (25 mg total) by mouth daily.         Not on File   The results of significant diagnostics from this hospitalization (including imaging, microbiology, ancillary and laboratory) are listed below for reference.   Consultations:   Procedures/Studies: CT Angio Chest PE W and/or Wo Contrast  Result Date: 04/22/2020 CLINICAL DATA:  Hypoxia with tachycardia EXAM: CT ANGIOGRAPHY CHEST WITH CONTRAST TECHNIQUE: Multidetector CT imaging of the chest was performed using the standard protocol during bolus administration of intravenous contrast. Multiplanar CT image reconstructions and MIPs were obtained to evaluate the vascular anatomy. CONTRAST:  51mL OMNIPAQUE IOHEXOL 350 MG/ML SOLN COMPARISON:  None. FINDINGS: Cardiovascular: Satisfactory opacification of the pulmonary arteries to the segmental level. No evidence of pulmonary embolism. Normal heart size. No pericardial effusion. Mediastinum/Nodes: Fluid within the distal esophagus may reflect reflux. Thyroid is unremarkable. There are no enlarged lymph nodes identified. Lungs/Pleura: Bilateral ground-glass greater than consolidative opacities. No pleural effusion. No pneumothorax. Upper Abdomen:  No acute abnormality. Musculoskeletal: No acute osseous abnormality. Review of the MIP images confirms the above findings. IMPRESSION: No evidence of acute pulmonary embolism. Bilateral pulmonary opacities likely reflect multifocal  pneumonia. Pulmonary edema is also a consideration. Electronically Signed   By: Guadlupe Spanish M.D.   On: 04/22/2020 18:26   DG Chest Portable 1 View  Result Date: 04/22/2020 CLINICAL DATA:  Hypoxia. EXAM: PORTABLE CHEST 1 VIEW COMPARISON:  None. FINDINGS: The heart size and mediastinal contours are within normal limits. Both lungs are clear. No pneumothorax or pleural effusion is noted. The visualized skeletal structures are unremarkable. IMPRESSION: No active disease. Electronically Signed   By: Lupita Raider M.D.   On: 04/22/2020 14:56      Labs: BNP (last 3 results) Recent Labs    04/22/20 1425  BNP 38.2   Basic Metabolic Panel: Recent Labs  Lab 04/22/20 1425 04/22/20 1652 04/23/20 0333  NA 136  --  136  K 3.2*  --  3.9  CL 104  --  105  CO2 23  --  25  GLUCOSE 219*  --  107*  BUN 13  --  9  CREATININE 1.23 0.99 1.05  CALCIUM 8.5*  --  8.5*   Liver Function Tests: Recent Labs  Lab 04/22/20 1425  AST 23  ALT 16  ALKPHOS 82  BILITOT 0.4  PROT 6.9  ALBUMIN 3.9   No results for input(s): LIPASE, AMYLASE in the last 168 hours. No results for input(s): AMMONIA in the last 168 hours. CBC: Recent Labs  Lab 04/22/20 1425 04/23/20 0333  WBC 15.6* 15.8*  NEUTROABS 10.6*  --   HGB 15.2 13.3  HCT 47.8 41.4  MCV 70.7* 70.3*  PLT 323 242   Cardiac Enzymes: No results for input(s): CKTOTAL, CKMB, CKMBINDEX, TROPONINI in the last 168 hours. BNP: Invalid input(s): POCBNP CBG: No results for input(s): GLUCAP in the last 168 hours. D-Dimer No results for input(s): DDIMER in the last 72 hours. Hgb A1c No results for input(s): HGBA1C in the last 72 hours. Lipid Profile No results for input(s): CHOL, HDL, LDLCALC, TRIG, CHOLHDL, LDLDIRECT in the last 72 hours. Thyroid function studies No results for input(s): TSH, T4TOTAL, T3FREE, THYROIDAB in the last 72 hours.  Invalid input(s): FREET3 Anemia work up No results for input(s): VITAMINB12, FOLATE, FERRITIN,  TIBC, IRON, RETICCTPCT in the last 72 hours. Urinalysis    Component Value Date/Time   COLORURINE YELLOW (A) 04/22/2020 1910   APPEARANCEUR CLEAR (A) 04/22/2020 1910   LABSPEC 1.034 (H) 04/22/2020 1910   PHURINE 5.0 04/22/2020 1910   GLUCOSEU NEGATIVE 04/22/2020 1910   HGBUR NEGATIVE 04/22/2020 1910   BILIRUBINUR NEGATIVE 04/22/2020 1910   KETONESUR NEGATIVE 04/22/2020 1910   PROTEINUR NEGATIVE 04/22/2020 1910   NITRITE NEGATIVE 04/22/2020 1910   LEUKOCYTESUR NEGATIVE 04/22/2020 1910   Sepsis Labs Invalid input(s): PROCALCITONIN,  WBC,  LACTICIDVEN Microbiology Recent Results (from the past 240 hour(s))  Resp Panel by RT-PCR (Flu A&B, Covid) Nasopharyngeal Swab     Status: None   Collection Time: 04/22/20  2:25 PM   Specimen: Nasopharyngeal Swab; Nasopharyngeal(NP) swabs in vial transport medium  Result Value Ref Range Status   SARS Coronavirus 2 by RT PCR NEGATIVE NEGATIVE Final    Comment: (NOTE) SARS-CoV-2 target nucleic acids are NOT DETECTED.  The SARS-CoV-2 RNA is generally detectable in upper respiratory specimens during the acute phase of infection. The lowest concentration of SARS-CoV-2 viral copies this assay can detect is 138 copies/mL. A  negative result does not preclude SARS-Cov-2 infection and should not be used as the sole basis for treatment or other patient management decisions. A negative result may occur with  improper specimen collection/handling, submission of specimen other than nasopharyngeal swab, presence of viral mutation(s) within the areas targeted by this assay, and inadequate number of viral copies(<138 copies/mL). A negative result must be combined with clinical observations, patient history, and epidemiological information. The expected result is Negative.  Fact Sheet for Patients:  BloggerCourse.comhttps://www.fda.gov/media/152166/download  Fact Sheet for Healthcare Providers:  SeriousBroker.ithttps://www.fda.gov/media/152162/download  This test is no t yet approved or  cleared by the [place] FDA and  has been authorized for detection and/or diagnosis of SARS-CoV-2 by FDA under an Emergency Use Authorization (EUA). This EUA will remain  in effect (meaning this test can be used) for the duration of the COVID-19 declaration under Section 564(b)(1) of the Act, 21 U.S.C.section 360bbb-3(b)(1), unless the authorization is terminated  or revoked sooner.       Influenza A by PCR NEGATIVE NEGATIVE Final   Influenza B by PCR NEGATIVE NEGATIVE Final    Comment: (NOTE) The Xpert Xpress SARS-CoV-2/FLU/RSV plus assay is intended as an aid in the diagnosis of influenza from Nasopharyngeal swab specimens and should not be used as a sole basis for treatment. Nasal washings and aspirates are unacceptable for Xpert Xpress SARS-CoV-2/FLU/RSV testing.  Fact Sheet for Patients: BloggerCourse.comhttps://www.fda.gov/media/152166/download  Fact Sheet for Healthcare Providers: SeriousBroker.ithttps://www.fda.gov/media/152162/download  This test is not yet approved or cleared by the [place] FDA and has been authorized for detection and/or diagnosis of SARS-CoV-2 by FDA under an Emergency Use Authorization (EUA). This EUA will remain in effect (meaning this test can be used) for the duration of the COVID-19 declaration under Section 564(b)(1) of the Act, 21 U.S.C. section 360bbb-3(b)(1), unless the authorization is terminated or revoked.  Performed at [facility], [address] [place], [place] [address]   Culture, blood (routine x 2) Call MD if unable to obtain prior to antibiotics being given     Status: None (Preliminary result)   Collection Time: 04/22/20  8:45 PM   Specimen: BLOOD  Result Value Ref Range Status   Specimen Description BLOOD RIGHT ANTECUBITAL  Final   Special Requests   Final    BOTTLES DRAWN AEROBIC AND ANAEROBIC Blood Culture results may not be optimal due to an excessive volume of blood received in culture bottles   Culture   Final    NO GROWTH < 12  HOURS Performed at [facility], [address] [place], [place] [address]    Report Status PENDING  Incomplete  Culture, blood (routine x 2) Call MD if unable to obtain prior to antibiotics being given     Status: None (Preliminary result)   Collection Time: 04/22/20  8:45 PM   Specimen: BLOOD  Result Value Ref Range Status   Specimen Description BLOOD BLOOD LEFT FOREARM  Final   Special Requests   Final    BOTTLES DRAWN AEROBIC AND ANAEROBIC Blood Culture adequate volume   Culture   Final    NO GROWTH < 12 HOURS Performed at [facility], [address] [place], [place] [address]    Report Status PENDING  Incomplete     Total time spend on discharging this patient, including the last patient exam, discussing the hospital stay, instructions for ongoing care as it relates to all pertinent caregivers, as well as preparing the medical discharge records, prescriptions, and/or referrals as applicable, is  45 minutes.    Darlin Priestly, MD  [facility] 04/23/2020, 1:05 PM

## 2020-04-23 NOTE — ED Notes (Signed)
Pt given graham crackers and orange juice at this time.

## 2020-04-27 LAB — CULTURE, BLOOD (ROUTINE X 2)
Culture: NO GROWTH
Culture: NO GROWTH
Special Requests: ADEQUATE

## 2021-07-24 DEATH — deceased
